# Patient Record
Sex: Female | Born: 1995 | Hispanic: Yes | Marital: Single | Smoking: Never smoker
Health system: Southern US, Community
[De-identification: ages and names within clinical notes are randomized; demographics above are authoritative.]

## PROBLEM LIST (undated history)

## (undated) DIAGNOSIS — Z8719 Personal history of other diseases of the digestive system: Secondary | ICD-10-CM

## (undated) HISTORY — DX: Personal history of other diseases of the digestive system: Z87.19

---
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
## 2017-06-12 ENCOUNTER — Other Ambulatory Visit
Admission: RE | Admit: 2017-06-12 | Discharge: 2017-06-12 | Disposition: A | Discharge status: Home / Self Care | Payer: Medicaid Other | Source: Ambulatory Visit | Attending: Pediatrics | Admitting: Pediatrics

## 2017-06-12 DIAGNOSIS — Z832 Family history of diseases of the blood and blood-forming organs and certain disorders involving the immune mechanism: Secondary | ICD-10-CM | POA: Insufficient documentation

## 2017-06-12 LAB — CBC WITH DIFFERENTIAL/PLATELET
BASOS ABS: 0.1 10*3/uL (ref 0–0.1)
Basophils Relative: 1 %
EOS PCT: 5 %
Eosinophils Absolute: 0.3 10*3/uL (ref 0–0.7)
HEMATOCRIT: 40.8 % (ref 35.0–47.0)
Hemoglobin: 13.9 g/dL (ref 12.0–16.0)
LYMPHS ABS: 2.5 10*3/uL (ref 1.0–3.6)
LYMPHS PCT: 42 %
MCH: 28.9 pg (ref 26.0–34.0)
MCHC: 34 g/dL (ref 32.0–36.0)
MCV: 84.9 fL (ref 80.0–100.0)
MONO ABS: 0.4 10*3/uL (ref 0.2–0.9)
Monocytes Relative: 7 %
NEUTROS ABS: 2.7 10*3/uL (ref 1.4–6.5)
Neutrophils Relative %: 45 %
PLATELETS: 290 10*3/uL (ref 150–440)
RBC: 4.81 MIL/uL (ref 3.80–5.20)
RDW: 13.8 % (ref 11.5–14.5)
WBC: 5.9 10*3/uL (ref 3.6–11.0)

## 2022-01-06 ENCOUNTER — Ambulatory Visit
Admission: EM | Admit: 2022-01-06 | Discharge: 2022-01-06 | Disposition: A | Discharge status: Home / Self Care | Payer: BC Managed Care – PPO | Attending: Emergency Medicine | Admitting: Emergency Medicine

## 2022-01-06 ENCOUNTER — Encounter: Payer: Self-pay | Admitting: Emergency Medicine

## 2022-01-06 DIAGNOSIS — N898 Other specified noninflammatory disorders of vagina: Secondary | ICD-10-CM | POA: Insufficient documentation

## 2022-01-06 DIAGNOSIS — Z113 Encounter for screening for infections with a predominantly sexual mode of transmission: Secondary | ICD-10-CM | POA: Insufficient documentation

## 2022-01-06 DIAGNOSIS — L292 Pruritus vulvae: Secondary | ICD-10-CM | POA: Insufficient documentation

## 2022-01-06 DIAGNOSIS — R300 Dysuria: Secondary | ICD-10-CM | POA: Diagnosis not present

## 2022-01-06 LAB — POCT URINALYSIS DIP (MANUAL ENTRY)
Bilirubin, UA: NEGATIVE
Blood, UA: NEGATIVE
Glucose, UA: NEGATIVE mg/dL
Ketones, POC UA: NEGATIVE mg/dL
Nitrite, UA: NEGATIVE
Protein Ur, POC: NEGATIVE mg/dL
Spec Grav, UA: 1.02 (ref 1.010–1.025)
Urobilinogen, UA: 0.2 E.U./dL
pH, UA: 7.5 (ref 5.0–8.0)

## 2022-01-06 LAB — POCT URINE PREGNANCY: Preg Test, Ur: NEGATIVE

## 2022-01-06 NOTE — ED Provider Notes (Signed)
[facility] Heidi Velazquez    CSN: [id number] Arrival date & time: 01/06/22  1057      History   Chief Complaint Chief Complaint  Patient presents with   Vaginal Itching    HPI Heidi Velazquez is a 26 y.o. female.  Patient presents with 3-week history of vaginal discharge and itching.  She reports external burning when urinating.  She denies fever, rash, abdominal pain, dysuria, hematuria, flank pain, pelvic pain, or other symptoms.  Patient was seen at [facility] on 12/23/2021; diagnosed with vaginal yeast infection and vaginitis; treated with Diflucan.  She reports she has been treated 2-3 times since October with medications for bacterial vaginosis and yeast vaginitis; these were prescribed by her PCP and at other urgent care.  She denies current pregnancy or breastfeeding.     The history is provided by the patient and medical records.    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 No pertinent surgical history.  OB History   No obstetric history on file.      Home Medications    Prior to Admission medications   Not on File    Family History Family History  Problem Relation Age of Onset   Anemia Mother    Hypertension Father    Diabetes Maternal Uncle    Diabetes Maternal Grandmother    Diabetes Paternal Grandmother    Cancer Paternal Grandfather    Inflammatory bowel disease Neg Hx    Ulcers Neg Hx     Social History Social History   Tobacco Use   Smoking status: Never     Allergies   Patient has no known allergies.   Review of Systems Review of Systems  Constitutional:  Negative for chills and fever.  Gastrointestinal:  Negative for abdominal pain, nausea and vomiting.  Genitourinary:  Positive for vaginal discharge. Negative for dysuria, frequency, hematuria and pelvic pain.   Skin:  Negative for color change and rash.  All other systems reviewed and are negative.    Physical Exam Triage Vital Signs ED Triage Vitals  Enc Vitals Group     BP      Pulse      Resp      Temp      Temp src      SpO2      Weight      Height      Head Circumference      Peak Flow      Pain Score      Pain Loc      Pain Edu?      Excl. in GC?    No data found.  Updated Vital Signs BP 114/77 (BP Location: Left Arm)   Pulse 88   Temp 98.6 F (37 C) (Oral)   Resp 16   Ht 4\' 11"  (1.499 m)   Wt 125 lb (56.7 kg)   LMP 12/23/2021   SpO2 98%   BMI 25.25 kg/m   Visual Acuity Right Eye Distance:   Left Eye Distance:   Bilateral Distance:    Right Eye Near:   Left Eye Near:    Bilateral Near:     Physical Exam Vitals and nursing note reviewed.  Constitutional:      General: She is not in acute distress.  Appearance: Normal appearance. She is well-developed. She is not ill-appearing.  HENT:     Mouth/Throat:     Mouth: Mucous membranes are moist.  Cardiovascular:     Rate and Rhythm: Normal rate and regular rhythm.     Heart sounds: Normal heart sounds.  Pulmonary:     Effort: Pulmonary effort is normal. No respiratory distress.     Breath sounds: Normal breath sounds.  Abdominal:     General: Bowel sounds are normal.     Palpations: Abdomen is soft.     Tenderness: There is no abdominal tenderness. There is no right CVA tenderness, left CVA tenderness, guarding or rebound.  Musculoskeletal:     Cervical back: Neck supple.  Skin:    General: Skin is warm and dry.  Neurological:     Mental Status: She is alert.  Psychiatric:        Mood and Affect: Mood normal.        Behavior: Behavior normal.      UC Treatments / Results  Labs (all labs ordered are listed, but only abnormal results are displayed) Labs Reviewed  POCT URINALYSIS DIP (MANUAL ENTRY) - Abnormal; Notable for the following components:      Result Value   Leukocytes, UA Small  (1+) (*)    All other components within normal limits  POCT URINE PREGNANCY  CERVICOVAGINAL ANCILLARY ONLY    EKG   Radiology No results found.  Procedures Procedures (including critical care time)  Medications Ordered in UC Medications - No data to display  Initial Impression / Assessment and Plan / UC Course  I have reviewed the triage vital signs and the nursing notes.  Pertinent labs & imaging results that were available during my care of the patient were reviewed by me and considered in my medical decision making (see chart for details).    Vaginal itching and discharge.  Patient has been treated several times in the past few months at other facilities.  Today she obtained vaginal self swab for testing.  Discussed that we will call if test results are positive.  Discussed that she may require treatment at that time.  Instructed patient to abstain from sexual activity for at least 7 days.  Instructed her to follow-up with her PCP or gynecologist if her symptoms are not improving.  Patient agrees to plan of care.   Final Clinical Impressions(s) / UC Diagnoses   Final diagnoses:  Vaginal discharge  Vaginal itching     Discharge Instructions      Your vaginal tests are pending.  If your test results are positive, we will call you.  You and your sexual partner(s) may require treatment at that time.  Do not have sexual activity for at least 7 days.    Follow up with your primary care provider or gynecologist if your symptoms are not improving.        ED Prescriptions   None    PDMP not reviewed this encounter.   Mickie Bail, NP 01/06/22 1352

## 2022-01-06 NOTE — Discharge Instructions (Signed)
Your vaginal tests are pending.  If your test results are positive, we will call you.  You and your sexual partner(s) may require treatment at that time.  Do not have sexual activity for at least 7 days.    Follow up with your primary care provider or gynecologist if your symptoms are not improving.

## 2022-01-06 NOTE — ED Triage Notes (Signed)
Patient in office c/o vaginal itching x3wks normal discharge  States urine burns when she pee  No change in habit per patient OTC: none  Denies: odor, detergent

## 2022-01-08 LAB — CERVICOVAGINAL ANCILLARY ONLY
Bacterial Vaginitis (gardnerella): NEGATIVE
Candida Glabrata: NEGATIVE
Candida Vaginitis: NEGATIVE
Chlamydia: NEGATIVE
Comment: NEGATIVE
Comment: NEGATIVE
Comment: NEGATIVE
Comment: NEGATIVE
Comment: NEGATIVE
Comment: NORMAL
Neisseria Gonorrhea: NEGATIVE
Trichomonas: NEGATIVE
